# Patient Record
Sex: Female | Born: 1990 | Race: White | Hispanic: No | Marital: Married | Smoking: Former smoker
Health system: Southern US, Community
[De-identification: ages and names within clinical notes are randomized; demographics above are authoritative.]

## PROBLEM LIST (undated history)

## (undated) DIAGNOSIS — M797 Fibromyalgia: Secondary | ICD-10-CM

## (undated) DIAGNOSIS — M5136 Other intervertebral disc degeneration, lumbar region: Secondary | ICD-10-CM

## (undated) DIAGNOSIS — M5134 Other intervertebral disc degeneration, thoracic region: Secondary | ICD-10-CM

## (undated) HISTORY — DX: Fibromyalgia: M79.7

## (undated) HISTORY — DX: Other intervertebral disc degeneration, lumbar region: M51.36

## (undated) HISTORY — DX: Other intervertebral disc degeneration, thoracic region: M51.34

---

## 2021-09-23 NOTE — Progress Notes (Signed)
°  Subjective:    Faith Lawson - 31 y.o. female MRN [id number]  Date of birth: 19-Jun-1991  HPI  Faith Lawson is to establish care. Recently moved from [place] to [place] 6 months ago.   Current issues and/or concerns: None   ROS per HPI     Health Maintenance:  Health Maintenance Due  Topic Date Due   COVID-19 Vaccine (1) Never done   HIV Screening  Never done   Hepatitis C Screening  Never done   TETANUS/TDAP  Never done   PAP SMEAR-Modifier  Never done     Past Medical History: There are no problems to display for this patient.   Social History   reports that she has quit smoking. Her smoking use included cigarettes. She has been exposed to tobacco smoke. She has never used smokeless tobacco. She reports current alcohol use. She reports that she does not use drugs.   Family History  family history includes Cancer - Colon in her maternal grandmother.   Medications: reviewed and updated   Objective:   Physical Exam BP 115/87 (BP Location: Left Arm, Patient Position: Sitting, Cuff Size: Normal)    Pulse 100    Temp 98.3 F (36.8 C)    Resp 18    Ht 5' 6.42" (1.687 m)    Wt 220 lb (99.8 kg)    SpO2 97%    BMI 35.06 kg/m   Physical Exam Eyes:     Extraocular Movements: Extraocular movements intact.     Conjunctiva/sclera: Conjunctivae normal.     Pupils: Pupils are equal, round, and reactive to light.  Cardiovascular:     Rate and Rhythm: Normal rate and regular rhythm.     Pulses: Normal pulses.     Heart sounds: Normal heart sounds.  Pulmonary:     Effort: Pulmonary effort is normal.     Breath sounds: Normal breath sounds.  Musculoskeletal:     Cervical back: Normal range of motion and neck supple.  Neurological:     General: No focal deficit present.     Mental Status: She is alert and oriented to person, place, and time.  Psychiatric:        Mood and Affect: Mood normal.        Behavior: Behavior normal.     Assessment & Plan:  1. Encounter  to establish care: - Patient presents today to establish care.  - Return for annual physical examination, labs, and health maintenance. Arrive fasting meaning having no food for at least 8 hours prior to appointment. You may have only water or black coffee. Please take scheduled medications as normal.    Patient was given clear instructions to go to Emergency Department or return to medical center if symptoms don't improve, worsen, or new problems develop.The patient verbalized understanding.  I discussed the assessment and treatment plan with the patient. The patient was provided an opportunity to ask questions and all were answered. The patient agreed with the plan and demonstrated an understanding of the instructions.   The patient was advised to call back or seek an in-person evaluation if the symptoms worsen or if the condition fails to improve as anticipated.    Ricky Stabs, NP 09/30/2021, 10:06 AM Primary Care at [facility]

## 2021-09-30 ENCOUNTER — Encounter (INDEPENDENT_AMBULATORY_CARE_PROVIDER_SITE_OTHER): Payer: Self-pay

## 2021-09-30 ENCOUNTER — Encounter: Payer: Self-pay | Admitting: Family

## 2021-09-30 ENCOUNTER — Other Ambulatory Visit: Payer: Self-pay

## 2021-09-30 ENCOUNTER — Ambulatory Visit (INDEPENDENT_AMBULATORY_CARE_PROVIDER_SITE_OTHER): Payer: 59 | Admitting: Family

## 2021-09-30 VITALS — BP 115/87 | HR 100 | Temp 98.3°F | Resp 18 | Ht 66.42 in | Wt 220.0 lb

## 2021-09-30 DIAGNOSIS — Z7689 Persons encountering health services in other specified circumstances: Secondary | ICD-10-CM | POA: Diagnosis not present

## 2021-09-30 NOTE — Patient Instructions (Signed)
Thank you for choosing Primary Care at [facility] for your medical home!    Faith Lawson was seen by Rema Fendt, NP today.   Donnamarie Poag primary care provider is Ricky Stabs, NP.   For the best care possible,  you should try to see Ricky Stabs, NP whenever you come to clinic.   We look forward to seeing you again soon!  If you have any questions about your visit today,  please call us at [phone]  Or feel free to reach your provider via MyChart.    Keeping you healthy   Get these tests Blood pressure- Have your blood pressure checked once a year by your healthcare provider.  Normal blood pressure is 120/80. Weight- Have your body mass index (BMI) calculated to screen for obesity.  BMI is a measure of body fat based on height and weight. You can also calculate your own BMI at https://www.west-esparza.com/. Cholesterol- Have your cholesterol checked regularly starting at age 10, sooner may be necessary if you have diabetes, high blood pressure, if a family member developed heart diseases at an early age or if you smoke.  Chlamydia, HIV, and other sexual transmitted disease- Get screened each year until the age of 55 then within three months of each new sexual partner. Diabetes- Have your blood sugar checked regularly if you have high blood pressure, high cholesterol, a family history of diabetes or if you are overweight.   Get these vaccines Flu shot- Every fall. Tetanus shot- Every 10 years. Menactra- Single dose; prevents meningitis.   Take these steps Don't smoke- If you do smoke, ask your healthcare provider about quitting. For tips on how to quit, go to www.smokefree.gov or call 1-800-QUIT-NOW. Be physically active- Exercise 5 days a week for at least 30 minutes.  If you are not already physically active start slow and gradually work up to 30 minutes of moderate physical activity.  Examples of moderate activity include walking briskly, mowing the yard, dancing, swimming  bicycling, etc. Eat a healthy diet- Eat a variety of healthy foods such as fruits, vegetables, low fat milk, low fat cheese, yogurt, lean meats, poultry, fish, beans, tofu, etc.  For more information on healthy eating, go to www.thenutritionsource.org Drink alcohol in moderation- Limit alcohol intake two drinks or less a day.  Never drink and drive. Dentist- Brush and floss teeth twice daily; visit your dentis twice a year. Depression-Your emotional health is as important as your physical health.  If you're feeling down, losing interest in things you normally enjoy please talk with your healthcare provider. Gun Safety- If you keep a gun in your home, keep it unloaded and with the safety lock on.  Bullets should be stored separately. Helmet use- Always wear a helmet when riding a motorcycle, bicycle, rollerblading or skateboarding. Safe sex- If you may be exposed to a sexually transmitted infection, use a condom Seat belts- Seat bels can save your life; always wear one. Smoke/Carbon Monoxide detectors- These detectors need to be installed on the appropriate level of your home.  Replace batteries at least once a year. Skin Cancer- When out in the sun, cover up and use sunscreen SPF 15 or higher. Violence- If anyone is threatening or hurting you, please tell your healthcare provider.

## 2021-09-30 NOTE — Progress Notes (Signed)
Pt presents to establish care, pt has moved here from [place] no major concerns today

## 2021-10-18 NOTE — Progress Notes (Signed)
Patient ID: Faith Lawson, female    DOB: [birth year]  MRN: [id number]  CC: Wrist Pain   Subjective: Faith Lawson is a 31 y.o. female who presents for wrist joint pain.   Her concerns today include:  Concerns for bilateral wrist and elbow pain, denies injury/trauma. Currently right > left. She is right-handed. Ongoing for years. Reports recently diagnosed with fibromyalgia around 12 months ago while living in [place] and thought wrist/elbow pain related to the same. At that time her primary care provider referred her to Rheumatology. The referral was denied stating Rheumatology did not see patients with fibromyalgia. 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   Recent appointment with physical therapist suggested patient may have Ehlers-Danlos. Was told to not remove the right wrist splint because it may cause significant side effects if she does. Patient reports she began to Google Ehlers-Danlos and became very concerned. Endorses heart palpitations lasting for minutes occurring at least weekly. Denies chest pain, shortness of breath, and additional red flag symptoms. Does have anxiety sometimes. Not interested in adding any medications at the moment until she finds out what is causing the wrist/elbow pain.    Depression screen [facility] 2/9 10/21/2021 09/30/2021  Decreased Interest 0 0  Down, Depressed, Hopeless 0 0  PHQ - 2 Score 0 0    Current Outpatient Medications on File Prior to Visit  Medication Sig Dispense Refill   cyclobenzaprine (FLEXERIL) 10 MG tablet Take 10 mg by mouth daily.     DULoxetine (CYMBALTA) 60 MG capsule Take 60 mg by mouth daily.     norethindrone-ethinyl estradiol-FE (HAILEY FE 1/20) 1-20 MG-MCG tablet Take 1 tablet by mouth daily.     zolpidem (AMBIEN) 10 MG tablet Take 10 mg by mouth at bedtime as needed for sleep.     No current facility-administered medications on file prior to visit.    Allergies  Allergen Reactions    Penicillins Rash    Social History   Socioeconomic History   Marital status: Married    Spouse name: Not on file   Number of children: Not on file   Years of education: Not on file   Highest education level: Not on file  Occupational History   Not on file  Tobacco Use   Smoking status: Former    Types: Cigarettes    Passive exposure: Current   Smokeless tobacco: Never  Vaping Use   Vaping Use: Never used  Substance and Sexual Activity   Alcohol use: Yes    Comment: occassionally   Drug use: Never   Sexual activity: Yes    Birth control/protection: Pill  Other Topics Concern   Not on file  Social History Narrative   Not on file   Social Determinants of Health   Financial Resource Strain: Not on file  Food Insecurity: Not on file  Transportation Needs: Not on file  Physical Activity: Not on file  Stress: Not on file  Social Connections: Not on file  Intimate Partner Violence: Not on file    Family History  Problem Relation Age of Onset   Cancer - Colon Maternal Grandmother     Past Surgical History:  Procedure Laterality Date   CESAREAN SECTION      ROS: Review of Systems Negative except as stated above  PHYSICAL EXAM: BP 114/79 (BP Location: Left Arm, Patient Position: Sitting, Cuff Size: Large)    Pulse 99    Temp 98.3 F (36.8 C)  Resp 16    Ht 5' 6.42" (1.687 m)    Wt 220 lb (99.8 kg)    SpO2 96%    BMI 35.06 kg/m   Physical Exam HENT:     Head: Normocephalic and atraumatic.  Eyes:     Extraocular Movements: Extraocular movements intact.     Conjunctiva/sclera: Conjunctivae normal.     Pupils: Pupils are equal, round, and reactive to light.  Cardiovascular:     Rate and Rhythm: Normal rate and regular rhythm.     Pulses: Normal pulses.     Heart sounds: Normal heart sounds.  Pulmonary:     Effort: Pulmonary effort is normal.     Breath sounds: Normal breath sounds.  Musculoskeletal:     Cervical back: Normal range of motion and neck  supple.  Neurological:     General: No focal deficit present.     Mental Status: She is alert and oriented to person, place, and time.  Psychiatric:        Mood and Affect: Mood normal.        Behavior: Behavior normal.   ASSESSMENT AND PLAN: 1. Fibromyalgia: - Referral to Rheumatology for further evaluation and management.  - Ambulatory referral to Rheumatology  2. Chronic pain of both wrists: 3. Chronic elbow pain, left: 4. Chronic elbow pain, right: - Referral to Orthopedic Surgery for further evaluation and management.  - Ambulatory referral to Orthopedic Surgery  5. Palpitations: - May be secondary to anxiety. - Referral to Cardiology for further evaluation and management. - Ambulatory referral to Cardiology    Patient was given the opportunity to ask questions.  Patient verbalized understanding of the plan and was able to repeat key elements of the plan. Patient was given clear instructions to go to Emergency Department or return to medical center if symptoms don't improve, worsen, or new problems develop.The patient verbalized understanding.   Orders Placed This Encounter  Procedures   Ambulatory referral to Orthopedic Surgery   Ambulatory referral to Rheumatology   Ambulatory referral to Cardiology    Follow-up with primary provider as scheduled.  Camillia Herter, NP

## 2021-10-21 ENCOUNTER — Other Ambulatory Visit: Payer: Self-pay

## 2021-10-21 ENCOUNTER — Ambulatory Visit (INDEPENDENT_AMBULATORY_CARE_PROVIDER_SITE_OTHER): Payer: 59 | Admitting: Family

## 2021-10-21 ENCOUNTER — Encounter: Payer: Self-pay | Admitting: Family

## 2021-10-21 VITALS — BP 114/79 | HR 99 | Temp 98.3°F | Resp 16 | Ht 66.42 in | Wt 220.0 lb

## 2021-10-21 DIAGNOSIS — M25531 Pain in right wrist: Secondary | ICD-10-CM

## 2021-10-21 DIAGNOSIS — M797 Fibromyalgia: Secondary | ICD-10-CM | POA: Diagnosis not present

## 2021-10-21 DIAGNOSIS — G8929 Other chronic pain: Secondary | ICD-10-CM

## 2021-10-21 DIAGNOSIS — R002 Palpitations: Secondary | ICD-10-CM

## 2021-10-21 DIAGNOSIS — M25522 Pain in left elbow: Secondary | ICD-10-CM | POA: Diagnosis not present

## 2021-10-21 DIAGNOSIS — M25521 Pain in right elbow: Secondary | ICD-10-CM

## 2021-10-21 DIAGNOSIS — M25532 Pain in left wrist: Secondary | ICD-10-CM

## 2021-10-21 NOTE — Progress Notes (Signed)
Pt presents for right wrist pain states that it is constant and it comes and goes often

## 2021-10-21 NOTE — Patient Instructions (Signed)
Wrist Pain, Adult ?There are many things that can cause wrist pain. Some common causes include: ?An injury to the wrist. ?Using the joint too much. ?A condition that causes too much pressure to be put on a nerve in the wrist (carpal tunnel syndrome). ?Wear and tear of the joints that happens as a person gets older (osteoarthritis). ?A condition that causes swelling and stiffness in the joints (arthritis). ?Sometimes, the cause of wrist pain is not known. ?Often, the pain goes away when you follow your doctor's instructions for easing pain at home. This may include resting your wrist, icing your wrist, or using a splint or an elastic wrap for a short time. It is important to tell your doctor if your wrist pain does not go away. ?Follow these instructions at home: ?If you have a splint or elastic wrap: ?Wear the splint or wrap as told by your doctor. Take it off only as told by your doctor. Ask if you can take it off for bathing. ?Loosen the splint or wrap if your fingers: ?Tingle. ?Become numb. ?Turn cold and blue. ?Check the skin around the splint or wrap every day. Tell your doctor about any concerns. ?Keep the splint or wrap clean. ?If the splint or wrap is not waterproof: ?Do not let it get wet. ?Cover it with a watertight covering when you take a bath or shower. ?Managing pain, stiffness, and swelling ? ?If told, put ice on the painful area. To do this: ?If you have a removable splint or wrap, take it off as told by your doctor. ?Put ice in a plastic bag. ?Place a towel between your skin and the bag or between your splint or wrap and the bag. ?Leave the ice on for 20 minutes, 2-3 times a day. ?Move your fingers often. ?Raise (elevate) the injured area above the level of your heart while you are sitting or lying down. ?Activity ?Rest your wrist as told by your doctor. ?Return to your normal activities as told by your doctor. Ask your doctor what activities are safe for you. ?Ask your doctor when it is safe to  drive if you have a splint or wrap on your wrist. ?Do exercises as told by your doctor. ?General instructions ?Pay attention to any changes in your symptoms. ?Take over-the-counter and prescription medicines only as told by your doctor. ?Keep all follow-up visits as told by your doctor. This is important. ?Contact a doctor if: ?You have a sudden, sharp pain in the wrist, hand, or arm that is different or new. ?The swelling or bruising on your wrist or hand gets worse. ?Your skin: ?Becomes red. ?Gets a rash. ?Has open sores. ?Your pain does not get better. ?Your pain gets worse. ?You have a fever or chills. ?Get help right away if: ?You lose feeling in your fingers or hand. ?Your fingers turn white, very red, or cold and blue. ?You cannot move your fingers. ?Summary ?There are many things that can cause wrist pain. ?It is important to tell your doctor if your wrist pain does not go away. ?You may need to wear a splint or a wrap for a short period of time. ?Return to your normal activities as told by your doctor. Ask your doctor what activities are safe for you. ?This information is not intended to replace advice given to you by your health care provider. Make sure you discuss any questions you have with your health care provider. ?Document Revised: 07/28/2019 Document Reviewed: 07/28/2019 ?Elsevier Patient Education ? 2022   Elsevier Inc. ? ?

## 2021-10-29 ENCOUNTER — Encounter: Payer: Self-pay | Admitting: Orthopedic Surgery

## 2021-10-29 ENCOUNTER — Other Ambulatory Visit: Payer: Self-pay

## 2021-10-29 ENCOUNTER — Ambulatory Visit (INDEPENDENT_AMBULATORY_CARE_PROVIDER_SITE_OTHER): Payer: 59 | Admitting: Family

## 2021-10-29 ENCOUNTER — Ambulatory Visit (INDEPENDENT_AMBULATORY_CARE_PROVIDER_SITE_OTHER): Payer: 59 | Admitting: Orthopedic Surgery

## 2021-10-29 DIAGNOSIS — M79642 Pain in left hand: Secondary | ICD-10-CM

## 2021-10-29 DIAGNOSIS — M79641 Pain in right hand: Secondary | ICD-10-CM

## 2021-10-29 DIAGNOSIS — Q796 Ehlers-Danlos syndrome, unspecified: Secondary | ICD-10-CM

## 2021-10-29 DIAGNOSIS — M25522 Pain in left elbow: Secondary | ICD-10-CM

## 2021-10-29 DIAGNOSIS — M25521 Pain in right elbow: Secondary | ICD-10-CM | POA: Diagnosis not present

## 2021-10-29 NOTE — Progress Notes (Signed)
Virtual Visit via Telephone Note  I connected with Faith Lawson, on 10/29/2021 at 3:19 PM by telephone due to the COVID-19 pandemic and verified that I am speaking with the correct person using two identifiers.  Due to current restrictions/limitations of in-office visits due to the COVID-19 pandemic, this scheduled clinical appointment was converted to a telehealth visit.   Consent: I discussed the limitations, risks, security and privacy concerns of performing an evaluation and management service by telephone and the availability of in person appointments. I also discussed with the patient that there may be a patient responsible charge related to this service. The patient expressed understanding and agreed to proceed.   Location of Patient: Home  Location of Provider: [facility]   Persons participating in Telemedicine visit: Wayna Chalet, NP Vertis Kelch, CMA   History of Present Illness: Faith Lawson is a 31 year-old female who presents for recent visit to Orthopedics office visit follow-up.   ORTHOPEDIC OFFICE VISIT FOLLOW-UP: 10/29/2021 at [facility] with Marlyne Beards, MD: Assessment & Plan: Visit Diagnoses:  1. Bilateral hand pain   2. Bilateral elbow joint pain       Plan: Discussed with patient that she has a lot of issues going on that seem to be related to her chronic back pain and fibromyalgia.  She does have some evidence of diffuse joint laxity.  She has definite midcarpal laxity with a subtle clunk on examination.  Discussed the role of therapy for joint stabilization and overall strengthening.  I do not have much to recommend at this point beyond therapy.  She is in an integrative therapy program now which I think is the best strategy for her.   Follow-Up Instructions: No follow-ups on file.   10/29/2021: Reports Orthopedic doctor recommended patient to follow-up with primary care for referral to  Rheumatology for possible Ehlers-Danlos syndrome.   Past Medical History:  Diagnosis Date   DDD (degenerative disc disease), lumbar    DDD (degenerative disc disease), thoracic    Fibromyalgia    Allergies  Allergen Reactions   Penicillins Rash    Current Outpatient Medications on File Prior to Visit  Medication Sig Dispense Refill   cyclobenzaprine (FLEXERIL) 10 MG tablet Take 10 mg by mouth daily.     DULoxetine (CYMBALTA) 60 MG capsule Take 60 mg by mouth daily.     norethindrone-ethinyl estradiol-FE (HAILEY FE 1/20) 1-20 MG-MCG tablet Take 1 tablet by mouth daily.     zolpidem (AMBIEN) 10 MG tablet Take 10 mg by mouth at bedtime as needed for sleep.     No current facility-administered medications on file prior to visit.    Observations/Objective: Alert and oriented x 3. Not in acute distress. Physical examination not completed as this is a telemedicine visit.  Assessment and Plan: 1. Ehlers-Danlos syndrome: - Referral to Rheumatology for further evaluation and management.  - Ambulatory referral to Rheumatology   Follow Up Instructions: Referral to Rheumatology. Follow-up with primary provider as scheduled.   Patient was given clear instructions to go to Emergency Department or return to medical center if symptoms don't improve, worsen, or new problems develop.The patient verbalized understanding.  I discussed the assessment and treatment plan with the patient. The patient was provided an opportunity to ask questions and all were answered. The patient agreed with the plan and demonstrated an understanding of the instructions.   The patient was advised to call back or seek an in-person evaluation if  the symptoms worsen or if the condition fails to improve as anticipated.    I provided 5 minutes total of non-face-to-face time during this encounter.   Rema Fendt, NP  [facility] at [facility] [place], [place] [phone] 10/29/2021, 3:19 PM

## 2021-10-29 NOTE — Progress Notes (Signed)
Office Visit Note   Patient: Faith Lawson           Date of Birth: 1991/09/02           MRN: [id number] Visit Date: 10/29/2021              Requested by: Rema Fendt, NP [address] [place],  [place] [address] PCP: Rema Fendt, NP   Assessment & Plan: Visit Diagnoses:  1. Bilateral hand pain   2. Bilateral elbow joint pain     Plan: Discussed with patient that she has a lot of issues going on that seem to be related to her chronic back pain and fibromyalgia.  She does have some evidence of diffuse joint laxity.  She has definite midcarpal laxity with a subtle clunk on examination.  Discussed the role of therapy for joint stabilization and overall strengthening.  I do not have much to recommend at this point beyond therapy.  She is in an integrative therapy program now which I think is the best strategy for her.  Follow-Up Instructions: No follow-ups on file.   Orders:  No orders of the defined types were placed in this encounter.  No orders of the defined types were placed in this encounter.     Procedures: No procedures performed   Clinical Data: No additional findings.   Subjective: Chief Complaint  Patient presents with   Right Wrist - Pain    Pops   Left Wrist - Pain    Pops     This is a 31 year old right-hand-dominant female with a history of fibromyalgia and chronic pain who presents with chronic pain involving bilateral elbows and wrists.  She says her right has been worse than the left.  This been going on for years and for as long as she can remember.  She describes pain in bilateral elbows and bilateral wrists that are worse with prolonged activity.  The wrist pain is poorly localized and seems diffuse at the volar and dorsal aspect of the wrist.  She says her wrist will occasionally "pop" and "shift".  She has no known injury.  She was told that she may have Ehlers-Danlos syndrome.  She also describes pain in multiple areas including  bilateral clavicles, shoulders, neck, knees, hips, spine.  She says she can feel her cervical spine become misaligned with subsequent nerve pinching.  She says she is able to realign her spine and decompress the nerve on her own.  She is currently in an integrative therapy program for her multiple complaints.   Review of Systems   Objective: Vital Signs: BP 106/74 (BP Location: Left Arm, Patient Position: Sitting)    Pulse (!) 118    Ht 5\' 6"  (1.676 m)    Wt 220 lb (99.8 kg)    BMI 35.51 kg/m   Physical Exam Constitutional:      Appearance: Normal appearance.  Cardiovascular:     Rate and Rhythm: Normal rate.     Pulses: Normal pulses.  Pulmonary:     Effort: Pulmonary effort is normal.  Skin:    General: Skin is warm and dry.     Capillary Refill: Capillary refill takes less than 2 seconds.  Neurological:     Mental Status: She is alert.    Right Hand Exam   Tenderness  The patient is experiencing no tenderness.   Range of Motion  The patient has normal right wrist ROM.   Comments:  Subtle "clunk" with shucking  of midcarpal joint w/ palmarly directed force on hand w/ wrist stabilized.  Wrist exam otherwise normal.    Left Hand Exam   Tenderness  The patient is experiencing no tenderness.   Range of Motion  The patient has normal left wrist ROM.  Comments:  Subtle "clunk" with shucking of midcarpal joint w/ palmarly directed force on hand w/ wrist stabilized.  Wrist exam otherwise normal.    Right Elbow Exam   Tenderness  The patient is experiencing no tenderness.   Range of Motion  The patient has normal right elbow ROM.  Muscle Strength  The patient has normal right elbow strength.  Other  Erythema: absent Sensation: normal Pulse: present   Left Elbow Exam   Tenderness  The patient is experiencing no tenderness.   Range of Motion  The patient has normal left elbow ROM.  Muscle Strength  The patient has normal left elbow strength.  Other   Erythema: absent Sensation: normal Pulse: present     Specialty Comments:  No specialty comments available.  Imaging: No results found.   PMFS History: Patient Active Problem List   Diagnosis Date Noted   Bilateral hand pain 10/29/2021   Bilateral elbow joint pain 10/29/2021   Past Medical History:  Diagnosis Date   DDD (degenerative disc disease), lumbar    DDD (degenerative disc disease), thoracic    Fibromyalgia     Family History  Problem Relation Age of Onset   Cancer - Colon Maternal Grandmother     Past Surgical History:  Procedure Laterality Date   CESAREAN SECTION     Social History   Occupational History   Not on file  Tobacco Use   Smoking status: Former    Types: Cigarettes    Passive exposure: Current   Smokeless tobacco: Never  Vaping Use   Vaping Use: Never used  Substance and Sexual Activity   Alcohol use: Yes    Comment: occassionally   Drug use: Never   Sexual activity: Yes    Birth control/protection: Pill

## 2021-11-04 ENCOUNTER — Ambulatory Visit (INDEPENDENT_AMBULATORY_CARE_PROVIDER_SITE_OTHER): Payer: 59 | Admitting: Internal Medicine

## 2021-11-04 ENCOUNTER — Other Ambulatory Visit: Payer: Self-pay

## 2021-11-04 VITALS — BP 126/88 | HR 88 | Ht 66.0 in | Wt 225.6 lb

## 2021-11-04 DIAGNOSIS — R002 Palpitations: Secondary | ICD-10-CM

## 2021-11-04 NOTE — Patient Instructions (Signed)
° °  Follow-Up: At [facility], you and your health needs are our priority.  As part of our continuing mission to provide you with exceptional heart care, we have created designated Provider Care Teams.  These Care Teams include your primary Cardiologist (physician) and Advanced Practice Providers (APPs -  Physician Assistants and Nurse Practitioners) who all work together to provide you with the care you need, when you need it.  Your next appointment:   As needed  The format for your next appointment:   In Person  Provider:   Maisie Fus, MD

## 2021-11-04 NOTE — Progress Notes (Deleted)
Cardiology Office Note:    Date:  11/04/2021   ID:  [name], DOB [birth year], MRN [id number]  PCP:  Rema Fendt, NP   [facility] Providers Cardiologist:  None { Click to update primary MD,subspecialty MD or APP then REFRESH:1}    Referring MD: Rema Fendt, NP   No chief complaint on file. Palpitations  History of Present Illness:    Faith Lawson is a 31 y.o. female with a hx of fibromyalgia, referral for palpitations  Past Medical History:  Diagnosis Date   DDD (degenerative disc disease), lumbar    DDD (degenerative disc disease), thoracic    Fibromyalgia     Past Surgical History:  Procedure Laterality Date   CESAREAN SECTION      Current Medications: No outpatient medications have been marked as taking for the 11/04/21 encounter (Appointment) with Maisie Fus, MD.     Allergies:   Penicillins   Social History   Socioeconomic History   Marital status: Married    Spouse name: Not on file   Number of children: Not on file   Years of education: Not on file   Highest education level: Not on file  Occupational History   Not on file  Tobacco Use   Smoking status: Former    Types: Cigarettes    Passive exposure: Current   Smokeless tobacco: Never  Vaping Use   Vaping Use: Never used  Substance and Sexual Activity   Alcohol use: Yes    Comment: occassionally   Drug use: Never   Sexual activity: Yes    Birth control/protection: Pill  Other Topics Concern   Not on file  Social History Narrative   Not on file   Social Determinants of Health   Financial Resource Strain: Not on file  Food Insecurity: Not on file  Transportation Needs: Not on file  Physical Activity: Not on file  Stress: Not on file  Social Connections: Not on file     Family History: The patient's ***family history includes Cancer - Colon in her maternal grandmother.  ROS:   Please see the history of present illness.    *** All other systems reviewed and are  negative.  EKGs/Labs/Other Studies Reviewed:    The following studies were reviewed today: ***  EKG:  EKG is *** ordered today.  The ekg ordered today demonstrates ***  Recent Labs: No results found for requested labs within last 8760 hours.  Recent Lipid Panel No results found for: CHOL, TRIG, HDL, CHOLHDL, VLDL, LDLCALC, LDLDIRECT   Risk Assessment/Calculations:   {Does this patient have ATRIAL FIBRILLATION?:[id number]}       Physical Exam:    VS:  There were no vitals taken for this visit.    Wt Readings from Last 3 Encounters:  10/29/21 220 lb (99.8 kg)  10/21/21 220 lb (99.8 kg)  09/30/21 220 lb (99.8 kg)     GEN: *** Well nourished, well developed in no acute distress HEENT: Normal NECK: No JVD; No carotid bruits LYMPHATICS: No lymphadenopathy CARDIAC: ***RRR, no murmurs, rubs, gallops RESPIRATORY:  Clear to auscultation without rales, wheezing or rhonchi  ABDOMEN: Soft, non-tender, non-distended MUSCULOSKELETAL:  No edema; No deformity  SKIN: Warm and dry NEUROLOGIC:  Alert and oriented x 3 PSYCHIATRIC:  Normal affect   ASSESSMENT:    No diagnosis found. PLAN:    In order of problems listed above:  ***      {Are you ordering a CV Procedure (e.g. stress test, cath, DCCV,  TEE, etc)?   Press F2        :[id number]}    Medication Adjustments/Labs and Tests Ordered: Current medicines are reviewed at length with the patient today.  Concerns regarding medicines are outlined above.  No orders of the defined types were placed in this encounter.  No orders of the defined types were placed in this encounter.   There are no Patient Instructions on file for this visit.   Signed, Maisie Fus, MD  11/04/2021 8:26 AM    [facility]

## 2021-11-04 NOTE — Progress Notes (Signed)
Cardiology Office Note:    Date:  11/04/2021   ID:  Lorre Munroe, DOB [birth year], MRN [id number]  PCP:  Camillia Herter, NP   [facility] Providers Cardiologist:  Janina Mayo, MD     Referring MD: Camillia Herter, NP   No chief complaint on file. Palpitations  History of Present Illness:    Faith Lawson is a 31 y.o. female with a hx of fibromyalgia, being worked for Ehlers-Danlos referral for palpitations  She notes that her heart beats fast. The sensation is random. There is no trigger. She is unclear when it started. She has episodes of orthostatic pre syncope. She notes one episode of syncope after standing a couple years ago.  No congenital heart disease hx. She is being working up for Ehlers-Danlos. Her EKG was normal. She has no family hx of Ehlers-Danlos, connective tissue disease or aortopathies. No family hx of SCD. She is not concerned about her infrequent palpitations. No hx of POTs.  Orthostatics:  Lying 128/86 mmHg, HR 83 bpm  Sitting 137/86 mmHg, HR 99 bpm  Standing  130/88 mmHg, 100 bpm  Standing 3 min 119/82 mmHg, 89 bpm  Past Medical History:  Diagnosis Date   DDD (degenerative disc disease), lumbar    DDD (degenerative disc disease), thoracic    Fibromyalgia     Past Surgical History:  Procedure Laterality Date   CESAREAN SECTION      Current Medications: No outpatient medications have been marked as taking for the 11/04/21 encounter (Office Visit) with Janina Mayo, MD.     Allergies:   Penicillins   Social History   Socioeconomic History   Marital status: Married    Spouse name: Not on file   Number of children: Not on file   Years of education: Not on file   Highest education level: Not on file  Occupational History   Not on file  Tobacco Use   Smoking status: Former    Types: Cigarettes    Passive exposure: Current   Smokeless tobacco: Never  Vaping Use   Vaping Use: Never used  Substance and Sexual Activity    Alcohol use: Yes    Comment: occassionally   Drug use: Never   Sexual activity: Yes    Birth control/protection: Pill  Other Topics Concern   Not on file  Social History Narrative   Not on file   Social Determinants of Health   Financial Resource Strain: Not on file  Food Insecurity: Not on file  Transportation Needs: Not on file  Physical Activity: Not on file  Stress: Not on file  Social Connections: Not on file     Family History: The patient's family history includes Cancer - Colon in her maternal grandmother.  ROS:   Please see the history of present illness.     All other systems reviewed and are negative.  EKGs/Labs/Other Studies Reviewed:    The following studies were reviewed today:   EKG:  EKG is  ordered today.  The ekg ordered today demonstrates NSR,   Recent Labs: No results found for requested labs within last 8760 hours.  Recent Lipid Panel No results found for: CHOL, TRIG, HDL, CHOLHDL, VLDL, LDLCALC, LDLDIRECT   Risk Assessment/Calculations:           Physical Exam:    VS:   Vitals:   11/04/21 1007  BP: 126/88  Pulse: 88  SpO2: 98%     Wt Readings from Last 3 Encounters:  11/04/21  225 lb 9.6 oz (102.3 kg)  10/29/21 220 lb (99.8 kg)  10/21/21 220 lb (99.8 kg)     GEN:  Well nourished, well developed in no acute distress HEENT: Normal NECK: No JVD; No carotid bruits LYMPHATICS: No lymphadenopathy CARDIAC: RRR, no murmurs, rubs, gallops RESPIRATORY:  Clear to auscultation without rales, wheezing or rhonchi  ABDOMEN: Soft, non-tender, non-distended MUSCULOSKELETAL:  No edema; No deformity  SKIN: Warm and dry NEUROLOGIC:  Alert and oriented x 3 PSYCHIATRIC:  Normal affect   ASSESSMENT:    #Palpitations: She has infrequent symptoms.  Her orthostatics were not suggestive of POTS. Discussed that if she has persistent LH, dizziness, syncope to let us know and can re-evaluate with a cardiac monitor. No further work up is planned at  this time.   PLAN:    In order of problems listed above:  Follow up PRN      Medication Adjustments/Labs and Tests Ordered: Current medicines are reviewed at length with the patient today.  Concerns regarding medicines are outlined above.  Orders Placed This Encounter  Procedures   EKG 12-Lead   No orders of the defined types were placed in this encounter.   Patient Instructions    Follow-Up: At [facility], you and your health needs are our priority.  As part of our continuing mission to provide you with exceptional heart care, we have created designated Provider Care Teams.  These Care Teams include your primary Cardiologist (physician) and Advanced Practice Providers (APPs -  Physician Assistants and Nurse Practitioners) who all work together to provide you with the care you need, when you need it.  Your next appointment:   As needed  The format for your next appointment:   In Person  Provider:   Janina Mayo, MD       Signed, Janina Mayo, MD  11/04/2021 10:47 AM    [place]

## 2022-01-16 NOTE — Progress Notes (Signed)
? ?Office Visit Note ? ?Patient: Faith Lawson             ?Date of Birth: Apr 12, 1991           ?MRN: [id number]             ?PCP: Rema FendtStephens, Amy J, NP ?Referring: Rema FendtStephens, Amy J, NP ?Visit Date: 01/29/2022 ?Occupation: @GUAROCC @ ? ?Subjective:  ?Pain in multiple joints and muscles ? ?History of Present Illness: Faith Lawson is a 31 y.o. female seen in consultation per request of her PCP for the evaluation of joint and muscle pain.  According the patient about 10 years ago [facility] in [facility] lower extremities.  At the time she was seen by a sports medicine doctor in [place] and had modified EMG and was diagnosed with compartment syndrome.  Patient states that she was advised not to have surgery.  She was advised to modify her physical activity which she did for several years.  She states over time the symptoms got worse with pain all over her body involving her joints and muscles.  She saw several physicians over the years.  [place] and was evaluated by her PCP who diagnosed her with fibromyalgia syndrome.  She was placed on Cymbalta and Flexeril about couple of years ago which helped to some extent only.  She was sent to several specialist including a neurologist as well as what to write an orthopedic surgeon.  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  She states she continues to have pain and stiffness all over.  She also has severe flares to the point she cannot get out of bed.  She has been also experiencing headaches which has been better for the last 2 years.  She notices popping in almost all of her joints.  She states that she has discomfort in her cervical, thoracic and lumbar spine, her shoulder joints pop she has discomfort in her wrist joints, hands, trochanteric area, knee joints, ankles and her feet.  She has popping sensation in almost all of her  joints.  [place] and establish with a PCP.  She is also concerned about hypermobility.  She had cardiology work-up here which was negative.  She continues to have palpitations.  She also gives history of chronic insomnia and fatigue.  Her mother has fibromyalgia and hypermobility.  There is history of MS in her maternal grandmother.  She is gravida 3, para 1, miscarriage 1, abortion 1.  There is no history of DVTs. ? ?Activities of Daily Living:  ?Patient reports morning stiffness for several hours.   ?Patient Reports nocturnal pain.  ?Difficulty dressing/grooming: Reports ?Difficulty climbing stairs: Reports ?Difficulty getting out of chair: Denies ?Difficulty using hands for taps, buttons, cutlery, and/or writing: Reports ? ?Review of Systems  ?Constitutional:  Positive for fatigue.  ?HENT:  Negative for mouth sores, mouth dryness and nose dryness.   ?Eyes:  Negative for pain, itching and dryness.  ?Respiratory:  Positive for shortness of breath. Negative for difficulty breathing.   ?Cardiovascular:  Positive for palpitations. Negative for chest pain.  ?     Cardiology work up negative per patient  ?Gastrointestinal:  Positive for constipation and diarrhea. Negative for blood in stool.  ?Endocrine: Negative for increased urination.  ?Genitourinary:  Negative for difficulty urinating.  ?Musculoskeletal:  Positive for  joint pain, joint pain, joint swelling, myalgias, morning stiffness, muscle tenderness and myalgias.  ?Skin:  Positive for color change. Negative for rash, redness and sensitivity to sunlight.  ?Allergic/Immunologic: Negative for susceptible to infections.  ?Neurological:  Positive for dizziness, numbness, headaches, memory loss and weakness.  ?Hematological:  Positive for bruising/bleeding tendency.  ?Psychiatric/Behavioral:  Positive for sleep disturbance. Negative for confusion. The patient is nervous/anxious.   ? ?PMFS History:  ?Patient Active Problem List  ? Diagnosis  Date Noted  ? Bilateral hand pain 10/29/2021  ? Bilateral elbow joint pain 10/29/2021  ?  ?Past Medical History:  ?Diagnosis Date  ? DDD (degenerative disc disease), lumbar   ? DDD (degenerative disc disease), thoracic   ? Fibromyalgia   ?  ?Family History  ?Problem Relation Age of Onset  ? Fibromyalgia Mother   ? Hypertension Father   ? Healthy Brother   ? Cancer - Colon Maternal Grandmother   ? Healthy Son   ? Heart murmur Son   ? ?Past Surgical History:  ?Procedure Laterality Date  ? CESAREAN SECTION    ? ?Social History  ? ?Social History Narrative  ? Not on file  ? ? ?There is no immunization history on file for this patient.  ? ?Objective: ?Vital Signs: BP 115/81 (BP Location: Right Arm, Patient Position: Sitting, Cuff Size: Normal)   Pulse 89   Ht 5\' 7"  (1.702 m)   Wt 227 lb (103 kg)   BMI 35.55 kg/m?   ? ?Physical Exam ?Vitals and nursing note reviewed.  ?Constitutional:   ?   Appearance: She is well-developed.  ?HENT:  ?   Head: Normocephalic and atraumatic.  ?Eyes:  ?   Conjunctiva/sclera: Conjunctivae normal.  ?Cardiovascular:  ?   Rate and Rhythm: Normal rate and regular rhythm.  ?   Heart sounds: Normal heart sounds.  ?Pulmonary:  ?   Effort: Pulmonary effort is normal.  ?   Breath sounds: Normal breath sounds.  ?Abdominal:  ?   General: Bowel sounds are normal.  ?   Palpations: Abdomen is soft.  ?Musculoskeletal:  ?   Cervical back: Normal range of motion.  ?Lymphadenopathy:  ?   Cervical: No cervical adenopathy.  ?Skin: ?   General: Skin is warm and dry.  ?   Capillary Refill: Capillary refill takes less than 2 seconds.  ?Neurological:  ?   Mental Status: She is alert and oriented to person, place, and time.  ?Psychiatric:     ?   Behavior: Behavior normal.  ?  ? ?Musculoskeletal Exam: C-spine thoracic and lumbar spine were in good range of motion.  She had mild tenderness in the midthoracic region.  There was no SI joint tenderness.  Shoulder joints, elbow joints, wrist joints with good range of  motion with no synovitis.  She had bilateral trapezius tenderness.  She had hypermobility in her MCPs, PIP and DIP joints.  Hip joints were in good range of motion without discomfort.  She had tenderness over bilateral trochanteric region.  She had good range of motion of bilateral knee joints without any warmth swelling or effusion.  She has some hypermobility in her ankle joints.  Bilateral pes cavus was noted. ? ?CDAI Exam: ?CDAI Score: -- ?Patient Global: --; Provider Global: -- ?Swollen: --; Tender: -- ?Joint Exam 01/29/2022  ? ?No joint exam has been documented for this visit  ? ?There is currently no information documented on the homunculus. Go to the Rheumatology activity and complete the homunculus joint exam. ? ?  Investigation: ?No additional findings. ? ?Imaging: ?No results found. ? ?Recent Labs: ?No results found for: WBC, HGB, PLT, NA, K, CL, CO2, GLUCOSE, BUN, CREATININE, BILITOT, ALKPHOS, AST, ALT, PROT, ALBUMIN, CALCIUM, GFRAA, QFTBGOLD, QFTBGOLDPLUS ? ?Speciality Comments: No specialty comments available. ? ?Procedures:  ?No procedures performed ?Allergies: Penicillins  ? ?Assessment / Plan:     ?Visit Diagnoses: Polyarthralgia-patient complains of pain and discomfort in multiple joints for over 10 years.  She states the pain gradually got worse over the last 8 years.  She has discomfort in her entire spine.  She was evaluated by neurologist in the past and was told that she had degenerative disc disease.  She also has discomfort in her shoulders, wrist joints, hands, ankles and feet.  She feels a popping sensation in her joints.  She states that she has flares when she has pain all over and notices some puffiness in her joints. ? ?Bilateral hand pain -she complains of pain and intermittent swelling in her hands.  Patient states she had work-up for lupus which was negative.  I will obtain following labs today.  Plan: Rheumatoid factor, Cyclic citrul peptide antibody, IgG, Sedimentation rate ? ?Pain  in both feet-she complains of popping sensation in her ankles and discomfort in her feet.  She is some hypermobility in her ankles.  She has bilateral pes cavus.  Proper fitting shoes with arch support we

## 2022-01-29 ENCOUNTER — Encounter: Payer: Self-pay | Admitting: Rheumatology

## 2022-01-29 ENCOUNTER — Ambulatory Visit (INDEPENDENT_AMBULATORY_CARE_PROVIDER_SITE_OTHER): Payer: 59 | Admitting: Rheumatology

## 2022-01-29 VITALS — BP 115/81 | HR 89 | Ht 67.0 in | Wt 227.0 lb

## 2022-01-29 DIAGNOSIS — Q796 Ehlers-Danlos syndrome, unspecified: Secondary | ICD-10-CM

## 2022-01-29 DIAGNOSIS — M79641 Pain in right hand: Secondary | ICD-10-CM

## 2022-01-29 DIAGNOSIS — M791 Myalgia, unspecified site: Secondary | ICD-10-CM

## 2022-01-29 DIAGNOSIS — M2550 Pain in unspecified joint: Secondary | ICD-10-CM

## 2022-01-29 DIAGNOSIS — M79672 Pain in left foot: Secondary | ICD-10-CM

## 2022-01-29 DIAGNOSIS — F5101 Primary insomnia: Secondary | ICD-10-CM

## 2022-01-29 DIAGNOSIS — M79671 Pain in right foot: Secondary | ICD-10-CM

## 2022-01-29 DIAGNOSIS — M249 Joint derangement, unspecified: Secondary | ICD-10-CM

## 2022-01-29 DIAGNOSIS — M797 Fibromyalgia: Secondary | ICD-10-CM

## 2022-01-29 DIAGNOSIS — M546 Pain in thoracic spine: Secondary | ICD-10-CM

## 2022-01-29 DIAGNOSIS — R5383 Other fatigue: Secondary | ICD-10-CM

## 2022-01-29 DIAGNOSIS — M25521 Pain in right elbow: Secondary | ICD-10-CM

## 2022-01-29 DIAGNOSIS — R002 Palpitations: Secondary | ICD-10-CM

## 2022-01-29 DIAGNOSIS — M62838 Other muscle spasm: Secondary | ICD-10-CM

## 2022-01-29 DIAGNOSIS — M79642 Pain in left hand: Secondary | ICD-10-CM

## 2022-01-30 ENCOUNTER — Other Ambulatory Visit: Payer: Self-pay | Admitting: *Deleted

## 2022-01-30 ENCOUNTER — Other Ambulatory Visit: Payer: Self-pay | Admitting: Family

## 2022-01-30 DIAGNOSIS — E559 Vitamin D deficiency, unspecified: Secondary | ICD-10-CM

## 2022-01-30 MED ORDER — VITAMIN D (ERGOCALCIFEROL) 1.25 MG (50000 UNIT) PO CAPS: 50000.0000 [IU] | ORAL_CAPSULE | ORAL | 0 refills | Status: AC

## 2022-01-30 NOTE — Telephone Encounter (Signed)
-----   Message from Pollyann Savoy, MD sent at 01/30/2022 12:15 PM EDT ----- ?Vitamin D is low at 19.  Please call in vitamin D 50,000 units once a week for 3 months.  She should stay on vitamin D 2000 units after she finishes the course of prescribed vitamin D.  She should have repeat vitamin D level in 3 months.  LFTs are el ?evated.  She should avoid all NSAIDs and Tylenol.  She should avoid alcohol intake. ?

## 2022-01-30 NOTE — Progress Notes (Signed)
Vitamin D is low at 19.  Please call in vitamin D 50,000 units once a week for 3 months.  She should stay on vitamin D 2000 units after she finishes the course of prescribed vitamin D.  She should have repeat vitamin D level in 3 months.  LFTs are elevated.  She should avoid all NSAIDs and Tylenol.  She should avoid alcohol intake.

## 2022-01-30 NOTE — Progress Notes (Signed)
Vitamin D prescribed on 01/30/2022 by Sherron Ales, PA.

## 2022-02-03 LAB — PROTEIN ELECTROPHORESIS, SERUM, WITH REFLEX
Albumin ELP: 4.1 g/dL (ref 3.8–4.8)
Alpha 1: 0.2 g/dL (ref 0.2–0.3)
Alpha 2: 0.6 g/dL (ref 0.5–0.9)
Beta 2: 0.3 g/dL (ref 0.2–0.5)
Beta Globulin: 0.4 g/dL (ref 0.4–0.6)
Gamma Globulin: 0.9 g/dL (ref 0.8–1.7)
Total Protein: 6.6 g/dL (ref 6.1–8.1)

## 2022-02-03 LAB — CBC WITH DIFFERENTIAL/PLATELET
Absolute Monocytes: 509 cells/uL (ref 200–950)
Basophils Absolute: 47 cells/uL (ref 0–200)
Basophils Relative: 0.7 %
Eosinophils Absolute: 147 cells/uL (ref 15–500)
Eosinophils Relative: 2.2 %
HCT: 42.3 % (ref 35.0–45.0)
Hemoglobin: 14 g/dL (ref 11.7–15.5)
Lymphs Abs: 2204 cells/uL (ref 850–3900)
MCH: 29.4 pg (ref 27.0–33.0)
MCHC: 33.1 g/dL (ref 32.0–36.0)
MCV: 88.9 fL (ref 80.0–100.0)
MPV: 9.7 fL (ref 7.5–12.5)
Monocytes Relative: 7.6 %
Neutro Abs: 3792 cells/uL (ref 1500–7800)
Neutrophils Relative %: 56.6 %
Platelets: 380 10*3/uL (ref 140–400)
RBC: 4.76 10*6/uL (ref 3.80–5.10)
RDW: 12.5 % (ref 11.0–15.0)
Total Lymphocyte: 32.9 %
WBC: 6.7 10*3/uL (ref 3.8–10.8)

## 2022-02-03 LAB — COMPLETE METABOLIC PANEL WITH GFR
AG Ratio: 1.6 (calc) (ref 1.0–2.5)
ALT: 60 U/L — ABNORMAL HIGH (ref 6–29)
AST: 38 U/L — ABNORMAL HIGH (ref 10–30)
Albumin: 4.1 g/dL (ref 3.6–5.1)
Alkaline phosphatase (APISO): 68 U/L (ref 31–125)
BUN: 11 mg/dL (ref 7–25)
CO2: 26 mmol/L (ref 20–32)
Calcium: 9.7 mg/dL (ref 8.6–10.2)
Chloride: 104 mmol/L (ref 98–110)
Creat: 0.81 mg/dL (ref 0.50–0.97)
Globulin: 2.6 g/dL (calc) (ref 1.9–3.7)
Glucose, Bld: 90 mg/dL (ref 65–99)
Potassium: 4.5 mmol/L (ref 3.5–5.3)
Sodium: 139 mmol/L (ref 135–146)
Total Bilirubin: 0.4 mg/dL (ref 0.2–1.2)
Total Protein: 6.7 g/dL (ref 6.1–8.1)
eGFR: 99 mL/min/{1.73_m2} (ref >=60)

## 2022-02-03 LAB — CK: Total CK: 93 U/L (ref 29–143)

## 2022-02-03 LAB — TSH: TSH: 1.32 mIU/L

## 2022-02-03 LAB — CYCLIC CITRUL PEPTIDE ANTIBODY, IGG: Cyclic Citrullin Peptide Ab: <16 UNITS

## 2022-02-03 LAB — VITAMIN D 25 HYDROXY (VIT D DEFICIENCY, FRACTURES): Vit D, 25-Hydroxy: 19 ng/mL — ABNORMAL LOW (ref 30–100)

## 2022-02-03 LAB — VITAMIN B12: Vitamin B-12: 484 pg/mL (ref 200–1100)

## 2022-02-03 LAB — RHEUMATOID FACTOR: Rheumatoid fact SerPl-aCnc: <14 IU/mL (ref <14)

## 2022-02-03 LAB — SEDIMENTATION RATE: Sed Rate: 2 mm/h (ref 0–20)

## 2022-02-03 NOTE — Progress Notes (Signed)
I will discuss results at the follow-up visit.

## 2022-02-05 NOTE — Progress Notes (Signed)
Office Visit Note  Patient: Faith Lawson             Date of Birth: 12/06/1990           MRN: [id number]             PCP: Camillia Herter, NP Referring: Camillia Herter, NP Visit Date: 02/19/2022 Occupation: @GUAROCC @  Subjective:  Pain in multiple joints and muscles  History of Present Illness: Faith Lawson is a 31 y.o. female returns today for the follow-up visit.  She has history of pain in multiple joints, fatigue, and myalgias.  She states she continues to have pain in thoracic spine and most of her muscles.  She still has discomfort in her shoulders, elbows, wrists, hands, knees and her ankles.  She has not noticed any joint swelling.  She was diagnosed with vitamin D deficiency and she has been taking vitamin D for the last month.  She was notified about the elevated LFTs.  She states she is not taking NSAIDs anymore.  She drinks alcohol only occasionally.  Activities of Daily Living:  Patient reports morning stiffness for all day. Patient Reports nocturnal pain.  Difficulty dressing/grooming: Denies Difficulty climbing stairs: Reports Difficulty getting out of chair: Denies Difficulty using hands for taps, buttons, cutlery, and/or writing: Reports  Review of Systems  Constitutional:  Positive for fatigue.  HENT:  Positive for nose dryness. Negative for mouth sores and mouth dryness.   Eyes:  Negative for pain, itching and dryness.  Respiratory:  Negative for shortness of breath and difficulty breathing.   Cardiovascular:  Positive for palpitations. Negative for chest pain.  Gastrointestinal:  Positive for constipation and diarrhea. Negative for blood in stool.  Endocrine: Negative for increased urination.  Genitourinary:  Negative for difficulty urinating.  Musculoskeletal:  Positive for joint pain, joint pain, myalgias, morning stiffness, muscle tenderness and myalgias. Negative for joint swelling.  Skin:  Negative for color change, rash, redness and sensitivity to  sunlight.  Allergic/Immunologic: Negative for susceptible to infections.  Neurological:  Positive for dizziness, numbness and weakness.  Hematological:  Positive for bruising/bleeding tendency.  Psychiatric/Behavioral:  Negative for depressed mood. The patient is not nervous/anxious.    PMFS History:  Patient Active Problem List   Diagnosis Date Noted   Vitamin D deficiency 02/19/2022   Bilateral hand pain 10/29/2021   Bilateral elbow joint pain 10/29/2021    Past Medical History:  Diagnosis Date   DDD (degenerative disc disease), lumbar    DDD (degenerative disc disease), thoracic    Fibromyalgia     Family History  Problem Relation Age of Onset   Fibromyalgia Mother    Osteoarthritis Mother    Hypertension Father    Healthy Brother    Cancer - Colon Maternal Grandmother    Healthy Son    Heart murmur Son    Past Surgical History:  Procedure Laterality Date   CESAREAN SECTION     Social History   Social History Narrative   Not on file    There is no immunization history on file for this patient.   Objective: Vital Signs: BP 115/82 (BP Location: Left Arm, Patient Position: Sitting, Cuff Size: Normal)   Pulse (!) 106   Ht 5' 6"  (1.676 m)   Wt 228 lb 6.4 oz (103.6 kg)   BMI 36.86 kg/m    Physical Exam Vitals and nursing note reviewed.  Constitutional:      Appearance: She is well-developed.  HENT:  Head: Normocephalic and atraumatic.  Eyes:     Conjunctiva/sclera: Conjunctivae normal.  Cardiovascular:     Rate and Rhythm: Normal rate and regular rhythm.     Heart sounds: Normal heart sounds.  Pulmonary:     Effort: Pulmonary effort is normal.     Breath sounds: Normal breath sounds.  Abdominal:     General: Bowel sounds are normal.     Palpations: Abdomen is soft.  Musculoskeletal:     Cervical back: Normal range of motion.  Lymphadenopathy:     Cervical: No cervical adenopathy.  Skin:    General: Skin is warm and dry.     Capillary Refill:  Capillary refill takes less than 2 seconds.  Neurological:     Mental Status: She is alert and oriented to person, place, and time.  Psychiatric:        Behavior: Behavior normal.     Musculoskeletal Exam: C-spine thoracic and lumbar spine were in good range of motion.  She had discomfort in the thoracic region.  Shoulder joints, elbow joints, wrist joints, MCPs PIPs and DIPs with good range of motion with no synovitis.  Hip joints, knee joints, ankles, MTPs and PIPs with good range of motion with no synovitis.  She had bilateral pes cavus.  CDAI Exam: CDAI Score: -- Patient Global: --; Provider Global: -- Swollen: --; Tender: -- Joint Exam 02/19/2022   No joint exam has been documented for this visit   There is currently no information documented on the homunculus. Go to the Rheumatology activity and complete the homunculus joint exam.  Investigation: No additional findings.  Imaging: No results found.  Recent Labs: Lab Results  Component Value Date   WBC 6.7 01/29/2022   HGB 14.0 01/29/2022   PLT 380 01/29/2022   NA 139 01/29/2022   K 4.5 01/29/2022   CL 104 01/29/2022   CO2 26 01/29/2022   GLUCOSE 90 01/29/2022   BUN 11 01/29/2022   CREATININE 0.81 01/29/2022   BILITOT 0.4 01/29/2022   AST 38 (H) 01/29/2022   ALT 60 (H) 01/29/2022   PROT 6.7 01/29/2022   PROT 6.6 01/29/2022   CALCIUM 9.7 01/29/2022   Jan 29, 2022 SPEP normal, ESR 2, CK 93, TSH normal, B12 normal, vitamin D 19, RF negative, anti-CCP negative  Speciality Comments: No specialty comments available.  Procedures:  No procedures performed Allergies: Penicillins   Assessment / Plan:     Visit Diagnoses: Polyarthralgia - History of pain in multiple joints over the last 10 years.  She c/o pain in her shoulders, wrists, hands, ankles and her feet.  H/o pain in the entire spine.  She had no point tenderness.  No synovitis was noted.  I believe most of the discomfort is coming from  fibromyalgia.  Bilateral hand pain - History of intermittent swelling in the hands.  No synovitis was noted.  All autoimmune work-up negative.  Lab findings were reviewed with the patient.  Pain in both feet - History of popping sensation in the ankles and discomfort in the feet.  Hypermobility in the ankle joints and bilateral pes cavus was noted.  Use of arch support was discussed.  Vitamin D deficiency - Vitamin D 19.  Patient was placed on vitamin D 50,000 units once a week on Jan 30, 2022.  We will recheck her vitamin D after 3 months of dosing.  Advised her to stay on vitamin D 2000 units daily after she finishes the course.  Elevated LFTs - Avoidance  of NSAIDs and alcohol intake was advised. -Patient states she occasionally takes NSAIDs and occasionally drinks alcohol.  She is completely stopped now.  Will recheck labs in couple of months with her vitamin D labs.  Plan: Hepatic function panel, Hepatitis B core antibody, IgM, Hepatitis B surface antigen, Hepatitis C antibody  Fibromyalgia - She was diagnosed with fibromyalgia in [place].  She has been on Cymbalta and Flexeril for 2 years which is helpful.  She has been going to integrative therapies for the last 2 months.  She has noticed improvement in her overall symptoms since she has been going to integrative therapies.  Need for regular exercise including water aerobics and swimming was emphasized.  Primary insomnia - History of chronic insomnia for many years.  She gets some relief from Flexeril.  Good sleep hygiene was discussed.  Other fatigue - Most likely related to fatigue and fibromyalgia.  Vitamin D deficiency could be contributing as well.  Trapezius muscle spasm - Physical therapy has been helpful.  Hypermobility of joint - She was referred to sports medicine.  Patient would like to wait before she will go to sports medicine at this point.  Pain in thoracic spine - She was diagnosed with degenerative disc disease in [place].  A handout on back exercises was given.  Palpitations - Cardiology work-up was negative per patient.  Orders: Orders Placed This Encounter  Procedures   Hepatic function panel   Hepatitis B core antibody, IgM   Hepatitis B surface antigen   Hepatitis C antibody   No orders of the defined types were placed in this encounter.    Follow-Up Instructions: Return in about 3 months (around 05/22/2022) for polyarthralgia.   Bo Merino, MD  Note - This record has been created using Editor, commissioning.  Chart creation errors have been sought, but may not always  have been located. Such creation errors do not reflect on  the standard of medical care.

## 2022-02-19 ENCOUNTER — Ambulatory Visit (INDEPENDENT_AMBULATORY_CARE_PROVIDER_SITE_OTHER): Payer: Self-pay | Admitting: Rheumatology

## 2022-02-19 ENCOUNTER — Encounter: Payer: Self-pay | Admitting: Rheumatology

## 2022-02-19 VITALS — BP 115/82 | HR 106 | Ht 66.0 in | Wt 228.4 lb

## 2022-02-19 DIAGNOSIS — M2550 Pain in unspecified joint: Secondary | ICD-10-CM

## 2022-02-19 DIAGNOSIS — R7989 Other specified abnormal findings of blood chemistry: Secondary | ICD-10-CM

## 2022-02-19 DIAGNOSIS — M249 Joint derangement, unspecified: Secondary | ICD-10-CM

## 2022-02-19 DIAGNOSIS — M79642 Pain in left hand: Secondary | ICD-10-CM

## 2022-02-19 DIAGNOSIS — M797 Fibromyalgia: Secondary | ICD-10-CM

## 2022-02-19 DIAGNOSIS — M62838 Other muscle spasm: Secondary | ICD-10-CM

## 2022-02-19 DIAGNOSIS — M546 Pain in thoracic spine: Secondary | ICD-10-CM

## 2022-02-19 DIAGNOSIS — M79672 Pain in left foot: Secondary | ICD-10-CM

## 2022-02-19 DIAGNOSIS — E559 Vitamin D deficiency, unspecified: Secondary | ICD-10-CM

## 2022-02-19 DIAGNOSIS — F5101 Primary insomnia: Secondary | ICD-10-CM

## 2022-02-19 DIAGNOSIS — R002 Palpitations: Secondary | ICD-10-CM

## 2022-02-19 DIAGNOSIS — R5383 Other fatigue: Secondary | ICD-10-CM

## 2022-02-19 DIAGNOSIS — M79671 Pain in right foot: Secondary | ICD-10-CM

## 2022-02-19 DIAGNOSIS — M79641 Pain in right hand: Secondary | ICD-10-CM

## 2022-02-19 NOTE — Patient Instructions (Signed)

## 2022-02-24 ENCOUNTER — Ambulatory Visit: Payer: Self-pay | Admitting: *Deleted

## 2022-02-24 ENCOUNTER — Encounter (HOSPITAL_BASED_OUTPATIENT_CLINIC_OR_DEPARTMENT_OTHER): Payer: Self-pay | Admitting: Pediatrics

## 2022-02-24 ENCOUNTER — Emergency Department (HOSPITAL_BASED_OUTPATIENT_CLINIC_OR_DEPARTMENT_OTHER)
Admission: EM | Admit: 2022-02-24 | Discharge: 2022-02-24 | Disposition: A | Discharge status: Home / Self Care | Payer: BLUE CROSS/BLUE SHIELD | Attending: Emergency Medicine | Admitting: Emergency Medicine

## 2022-02-24 ENCOUNTER — Other Ambulatory Visit: Payer: Self-pay

## 2022-02-24 ENCOUNTER — Emergency Department (HOSPITAL_BASED_OUTPATIENT_CLINIC_OR_DEPARTMENT_OTHER): Payer: BLUE CROSS/BLUE SHIELD

## 2022-02-24 DIAGNOSIS — X509XXA Other and unspecified overexertion or strenuous movements or postures, initial encounter: Secondary | ICD-10-CM | POA: Insufficient documentation

## 2022-02-24 DIAGNOSIS — M546 Pain in thoracic spine: Secondary | ICD-10-CM | POA: Diagnosis present

## 2022-02-24 MED ORDER — KETOROLAC TROMETHAMINE 15 MG/ML IJ SOLN
15.0000 mg | Freq: Once | INTRAMUSCULAR | Status: AC
  Administered 2022-02-24: 15 mg via INTRAMUSCULAR
  Filled 2022-02-24: qty 1

## 2022-02-24 MED ORDER — CYCLOBENZAPRINE HCL 5 MG PO TABS
5.0000 mg | ORAL_TABLET | Freq: Once | ORAL | Status: AC
  Administered 2022-02-24: 5 mg via ORAL
  Filled 2022-02-24: qty 1

## 2022-02-24 MED ORDER — NAPROXEN 375 MG PO TABS: 375.0000 mg | ORAL_TABLET | Freq: Two times a day (BID) | ORAL | 0 refills | Status: AC

## 2022-02-24 NOTE — ED Triage Notes (Signed)
C/O back pain started last night after some hypermobility stretches;

## 2022-02-24 NOTE — ED Provider Notes (Signed)
[facility] EMERGENCY DEPARTMENT Provider Note   CSN: [id number] Arrival date & time: 02/24/22  1209     History Chief Complaint  Patient presents with   Back Pain    Faith Lawson is a 31 y.o. female who presents to the emergency department with upper back pain that started yesterday.  Patient has a history of hypermobility syndrome.  While she was stretching she felt a popping sensation in her spine and followed by immediate pain.  She had trouble using the left arm yesterday.  Her friend came over and massaged her back in "put her spine back in."  After that incident patient was able to move her left arm better but she has had pain since.   Back Pain     Home Medications Prior to Admission medications   Medication Sig Start Date End Date Taking? Authorizing Provider  naproxen (NAPROSYN) 375 MG tablet Take 1 tablet (375 mg total) by mouth 2 (two) times daily. 02/24/22  Yes Wanza Szumski M, PA-C  cyclobenzaprine (FLEXERIL) 10 MG tablet Take 10 mg by mouth daily.    [provider]  DULoxetine (CYMBALTA) 60 MG capsule Take 60 mg by mouth daily.    [provider]  Vitamin D, Ergocalciferol, (DRISDOL) 1.25 MG (50000 UNIT) CAPS capsule Take 1 capsule (50,000 Units total) by mouth every 7 (seven) days. 01/30/22   Gearldine Bienenstock, PA-C      Allergies    Penicillins    Review of Systems   Review of Systems  Musculoskeletal:  Positive for back pain.  All other systems reviewed and are negative.  Physical Exam Updated Vital Signs BP 107/83   Pulse 87   Temp 98.8 F (37.1 C) (Oral)   Resp 18   Ht 5\' 6"  (1.676 m)   Wt (!) 225 kg   LMP 02/08/2022 (Approximate)   SpO2 100%   BMI 80.06 kg/m  Physical Exam Vitals and nursing note reviewed.  Constitutional:      Appearance: Normal appearance.  HENT:     Head: Normocephalic and atraumatic.  Eyes:     General:        Right eye: No discharge.        Left eye: No discharge.     Conjunctiva/sclera:  Conjunctivae normal.  Pulmonary:     Effort: Pulmonary effort is normal.  Musculoskeletal:     Comments: There is mild tenderness to the thoracic spine.  Patient has 5/5 grip strength and full range of motion in the arms.  Normal sensation in the upper extremities.  Skin:    General: Skin is warm and dry.     Findings: No rash.  Neurological:     General: No focal deficit present.     Mental Status: She is alert.  Psychiatric:        Mood and Affect: Mood normal.        Behavior: Behavior normal.    ED Results / Procedures / Treatments   Labs (all labs ordered are listed, but only abnormal results are displayed) Labs Reviewed - No data to display  EKG None  Radiology DG Thoracic Spine 2 View  Result Date: 02/24/2022 CLINICAL DATA:  upper back pain. hx of hypermobility syndrome EXAM: THORACIC SPINE 2 VIEWS COMPARISON:  None Available. FINDINGS: There is no evidence of thoracic spine fracture. Alignment is normal. No other significant bone abnormalities are identified. IMPRESSION: Negative. Electronically Signed   By: 04/26/2022 M.D.   On: 02/24/2022 16:30  Procedures Procedures    Medications Ordered in ED Medications  cyclobenzaprine (FLEXERIL) tablet 5 mg (has no administration in time range)  ketorolac (TORADOL) 15 MG/ML injection 15 mg (15 mg Intramuscular Given 02/24/22 1556)    ED Course/ Medical Decision Making/ A&P                           Medical Decision Making Faith Lawson is a 31 y.o. female patient who presents to the emerged department with upper back pain after doing some stretching.  I have a low suspicion for acute spinal pathology at this time.  Given that the patient does have hypermobility syndrome we will get an x-ray to further assess.  I personally reviewed the thoracic spine x-ray which did not show any fracture dislocations.  I do agree with the radiologist interpretation.  This is likely a muscular strain.  We will give her naproxen to go  home with.  I will have her follow-up with your primary care provider for further evaluation.  Strict return precautions were discussed with the patient at the bedside.  She is safe for discharge.   Amount and/or Complexity of Data Reviewed Radiology: ordered.  Risk Prescription drug management.   Final Clinical Impression(s) / ED Diagnoses Final diagnoses:  Acute midline thoracic back pain    Rx / DC Orders ED Discharge Orders          Ordered    naproxen (NAPROSYN) 375 MG tablet  2 times daily        02/24/22 1642              Honor Loh [place], [place] 02/24/22 1645    Long, Arlyss Repress, MD 02/25/22 1323

## 2022-02-24 NOTE — Telephone Encounter (Signed)
  Chief Complaint: severe back pain Symptoms: upper back between shoulders painful and felt pop last night. Weakness picking up heavier objects Frequency: last night  Pertinent Negatives: Patient denies numbness in bilateral hands  Disposition: [x] ED /[] Urgent Care (no appt availability in office) / [] Appointment(In office/virtual)/ []  [facility]/ [] Home Care/ [] Refused Recommended Disposition /[] [facility]/ []  Follow-up with PCP Additional Notes:  Recommended  ED due to severe pain    Reason for Disposition  [1] SEVERE back pain (e.g., excruciating, unable to do any normal activities) AND [2] not improved 2 hours after pain medicine  Answer Assessment - Initial Assessment Questions 1. ONSET: "When did the pain begin?"      Yesterday  2. LOCATION: "Where does it hurt?" (upper, mid or lower back)     Upper back  3. SEVERITY: "How bad is the pain?"  (e.g., Scale 1-10; mild, moderate, or severe)   - MILD (1-3): doesn't interfere with normal activities    - MODERATE (4-7): interferes with normal activities or awakens from sleep    - SEVERE (8-10): excruciating pain, unable to do any normal activities      severe 4. PATTERN: "Is the pain constant?" (e.g., yes, no; constant, intermittent)     Constant  5. RADIATION: "Does the pain shoot into your legs or elsewhere?"     arms 6. CAUSE:  "What do you think is causing the back pain?"      Not sure felt back pop last night  7. BACK OVERUSE:  "Any recent lifting of heavy objects, strenuous work or exercise?"     na 8. MEDICATIONS: "What have you taken so far for the pain?" (e.g., nothing, acetaminophen, NSAIDS)     na 9. NEUROLOGIC SYMPTOMS: "Do you have any weakness, numbness, or problems with bowel/bladder control?"     Upper extremities with difficulty picking up heavier objects  10. OTHER SYMPTOMS: "Do you have any other symptoms?" (e.g., fever, abdominal pain, burning with urination, blood in urine)       Pain in  upper back  11. PREGNANCY: "Is there any chance you are pregnant?" (e.g., yes, no; LMP)       na  Protocols used: Back Pain-A-AH

## 2022-02-24 NOTE — Discharge Instructions (Addendum)
X-ray was negative which is great news.  This is likely a muscular or spinal strain.  I given you some anti-inflammatories to take twice per day.  I would like you to follow-up with your primary care doctor for further evaluation.  Please return to the emergency department for any worsening symptoms you might have.

## 2022-04-09 ENCOUNTER — Other Ambulatory Visit: Payer: Self-pay | Admitting: *Deleted

## 2022-04-09 DIAGNOSIS — R7989 Other specified abnormal findings of blood chemistry: Secondary | ICD-10-CM

## 2022-04-10 LAB — HEPATIC FUNCTION PANEL
AG Ratio: 1.8 (calc) (ref 1.0–2.5)
ALT: 26 U/L (ref 6–29)
AST: 23 U/L (ref 10–30)
Albumin: 4.2 g/dL (ref 3.6–5.1)
Alkaline phosphatase (APISO): 59 U/L (ref 31–125)
Bilirubin, Direct: 0.1 mg/dL (ref 0.0–0.2)
Globulin: 2.4 g/dL (calc) (ref 1.9–3.7)
Indirect Bilirubin: 0.4 mg/dL (calc) (ref 0.2–1.2)
Total Bilirubin: 0.5 mg/dL (ref 0.2–1.2)
Total Protein: 6.6 g/dL (ref 6.1–8.1)

## 2022-04-10 LAB — HEPATITIS C ANTIBODY: Hepatitis C Ab: NONREACTIVE

## 2022-04-10 LAB — HEPATITIS B CORE ANTIBODY, IGM: Hep B C IgM: NONREACTIVE

## 2022-04-10 LAB — HEPATITIS B SURFACE ANTIGEN: Hepatitis B Surface Ag: NONREACTIVE

## 2022-04-10 NOTE — Progress Notes (Signed)
Repeat liver functions are normal.  Labs are negative for hepatitis B and hepatitis C.

## 2022-05-19 NOTE — Progress Notes (Deleted)
   Office Visit Note  Patient: Faith Lawson             Date of Birth: June 01, 1991           MRN: [id number]             PCP: Rema Fendt, NP Referring: Rema Fendt, NP Visit Date: 06/02/2022 Occupation: @GUAROCC @  Subjective:  No chief complaint on file.   History of Present Illness: Faith Lawson is a 31 y.o. female ***   Activities of Daily Living:  Patient reports morning stiffness for *** {minute/hour:19697}.   Patient {ACTIONS;DENIES/REPORTS:21021675::"Denies"} nocturnal pain.  Difficulty dressing/grooming: {ACTIONS;DENIES/REPORTS:21021675::"Denies"} Difficulty climbing stairs: {ACTIONS;DENIES/REPORTS:21021675::"Denies"} Difficulty getting out of chair: {ACTIONS;DENIES/REPORTS:21021675::"Denies"} Difficulty using hands for taps, buttons, cutlery, and/or writing: {ACTIONS;DENIES/REPORTS:21021675::"Denies"}  No Rheumatology ROS completed.   PMFS History:  Patient Active Problem List   Diagnosis Date Noted   Vitamin D deficiency 02/19/2022   Bilateral hand pain 10/29/2021   Bilateral elbow joint pain 10/29/2021    Past Medical History:  Diagnosis Date   DDD (degenerative disc disease), lumbar    DDD (degenerative disc disease), thoracic    Fibromyalgia     Family History  Problem Relation Age of Onset   Fibromyalgia Mother    Osteoarthritis Mother    Hypertension Father    Healthy Brother    Cancer - Colon Maternal Grandmother    Healthy Son    Heart murmur Son    Past Surgical History:  Procedure Laterality Date   CESAREAN SECTION     Social History   Social History Narrative   Not on file    There is no immunization history on file for this patient.   Objective: Vital Signs: There were no vitals taken for this visit.   Physical Exam   Musculoskeletal Exam: ***  CDAI Exam: CDAI Score: -- Patient Global: --; Provider Global: -- Swollen: --; Tender: -- Joint Exam 06/02/2022   No joint exam has been documented for this visit   There  is currently no information documented on the homunculus. Go to the Rheumatology activity and complete the homunculus joint exam.  Investigation: No additional findings.  Imaging: No results found.  Recent Labs: Lab Results  Component Value Date   WBC 6.7 01/29/2022   HGB 14.0 01/29/2022   PLT 380 01/29/2022   NA 139 01/29/2022   K 4.5 01/29/2022   CL 104 01/29/2022   CO2 26 01/29/2022   GLUCOSE 90 01/29/2022   BUN 11 01/29/2022   CREATININE 0.81 01/29/2022   BILITOT 0.5 04/09/2022   AST 23 04/09/2022   ALT 26 04/09/2022   PROT 6.6 04/09/2022   CALCIUM 9.7 01/29/2022    Speciality Comments: No specialty comments available.  Procedures:  No procedures performed Allergies: Penicillins   Assessment / Plan:     Visit Diagnoses: No diagnosis found.  Orders: No orders of the defined types were placed in this encounter.  No orders of the defined types were placed in this encounter.   Face-to-face time spent with patient was *** minutes. Greater than 50% of time was spent in counseling and coordination of care.  Follow-Up Instructions: No follow-ups on file.   03/31/2022, CMA  Note - This record has been created using Ellen Henri.  Chart creation errors have been sought, but may not always  have been located. Such creation errors do not reflect on  the standard of medical care.

## 2022-06-02 ENCOUNTER — Ambulatory Visit: Payer: Self-pay | Admitting: Physician Assistant

## 2022-06-02 DIAGNOSIS — M797 Fibromyalgia: Secondary | ICD-10-CM

## 2022-06-02 DIAGNOSIS — E559 Vitamin D deficiency, unspecified: Secondary | ICD-10-CM

## 2022-06-02 DIAGNOSIS — M79641 Pain in right hand: Secondary | ICD-10-CM

## 2022-06-02 DIAGNOSIS — R5383 Other fatigue: Secondary | ICD-10-CM

## 2022-06-02 DIAGNOSIS — M546 Pain in thoracic spine: Secondary | ICD-10-CM

## 2022-06-02 DIAGNOSIS — M79671 Pain in right foot: Secondary | ICD-10-CM

## 2022-06-02 DIAGNOSIS — F5101 Primary insomnia: Secondary | ICD-10-CM

## 2022-06-02 DIAGNOSIS — M2550 Pain in unspecified joint: Secondary | ICD-10-CM

## 2022-06-02 DIAGNOSIS — M62838 Other muscle spasm: Secondary | ICD-10-CM

## 2022-06-02 DIAGNOSIS — R002 Palpitations: Secondary | ICD-10-CM

## 2022-06-02 DIAGNOSIS — R7989 Other specified abnormal findings of blood chemistry: Secondary | ICD-10-CM

## 2022-06-02 DIAGNOSIS — M249 Joint derangement, unspecified: Secondary | ICD-10-CM

## 2023-02-19 IMAGING — DX DG THORACIC SPINE 2V
3 series · 3 of 3 positions shown · non-contrast
Comparison: None Available.

CLINICAL DATA: upper back pain. hx of hypermobility syndrome

EXAM:
THORACIC SPINE 2 VIEWS

[t-spine ap]
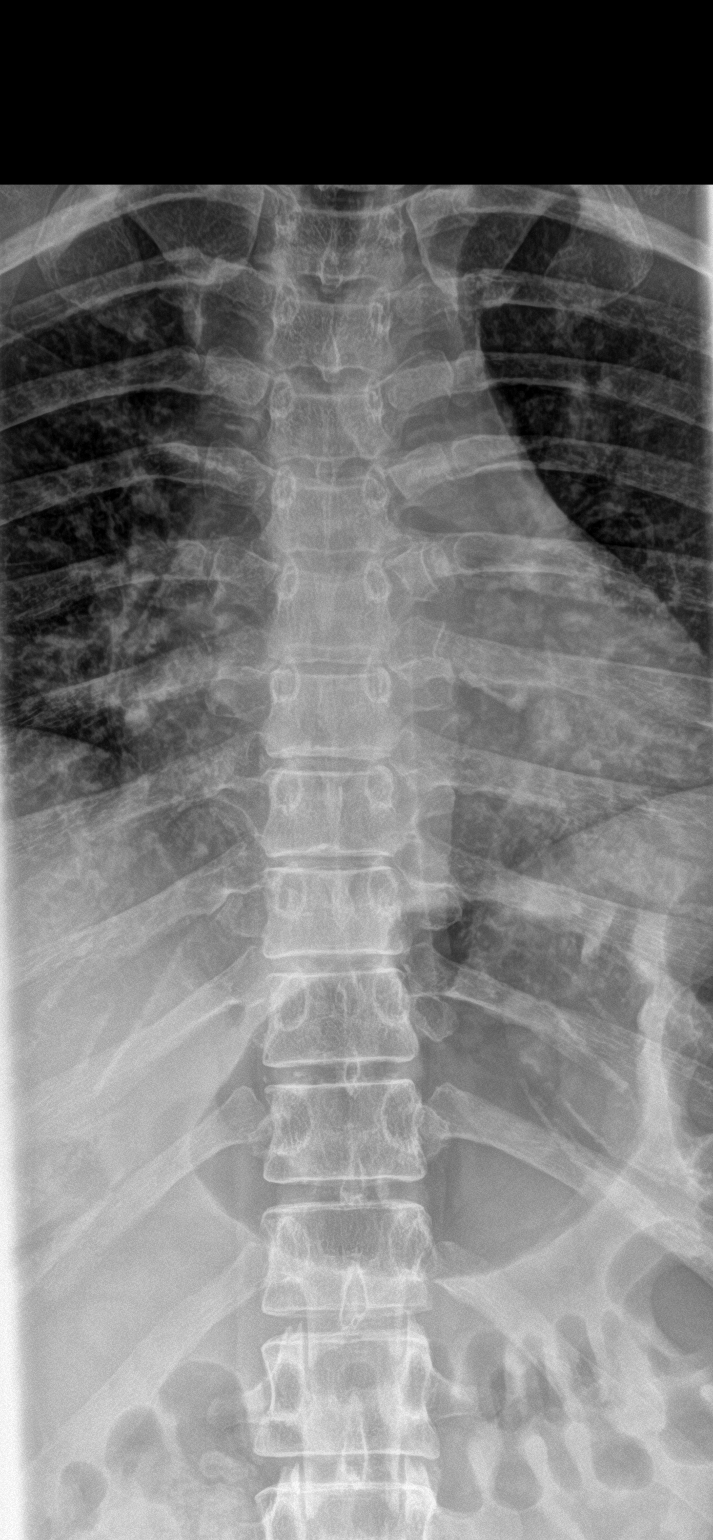

[t-spine lat]
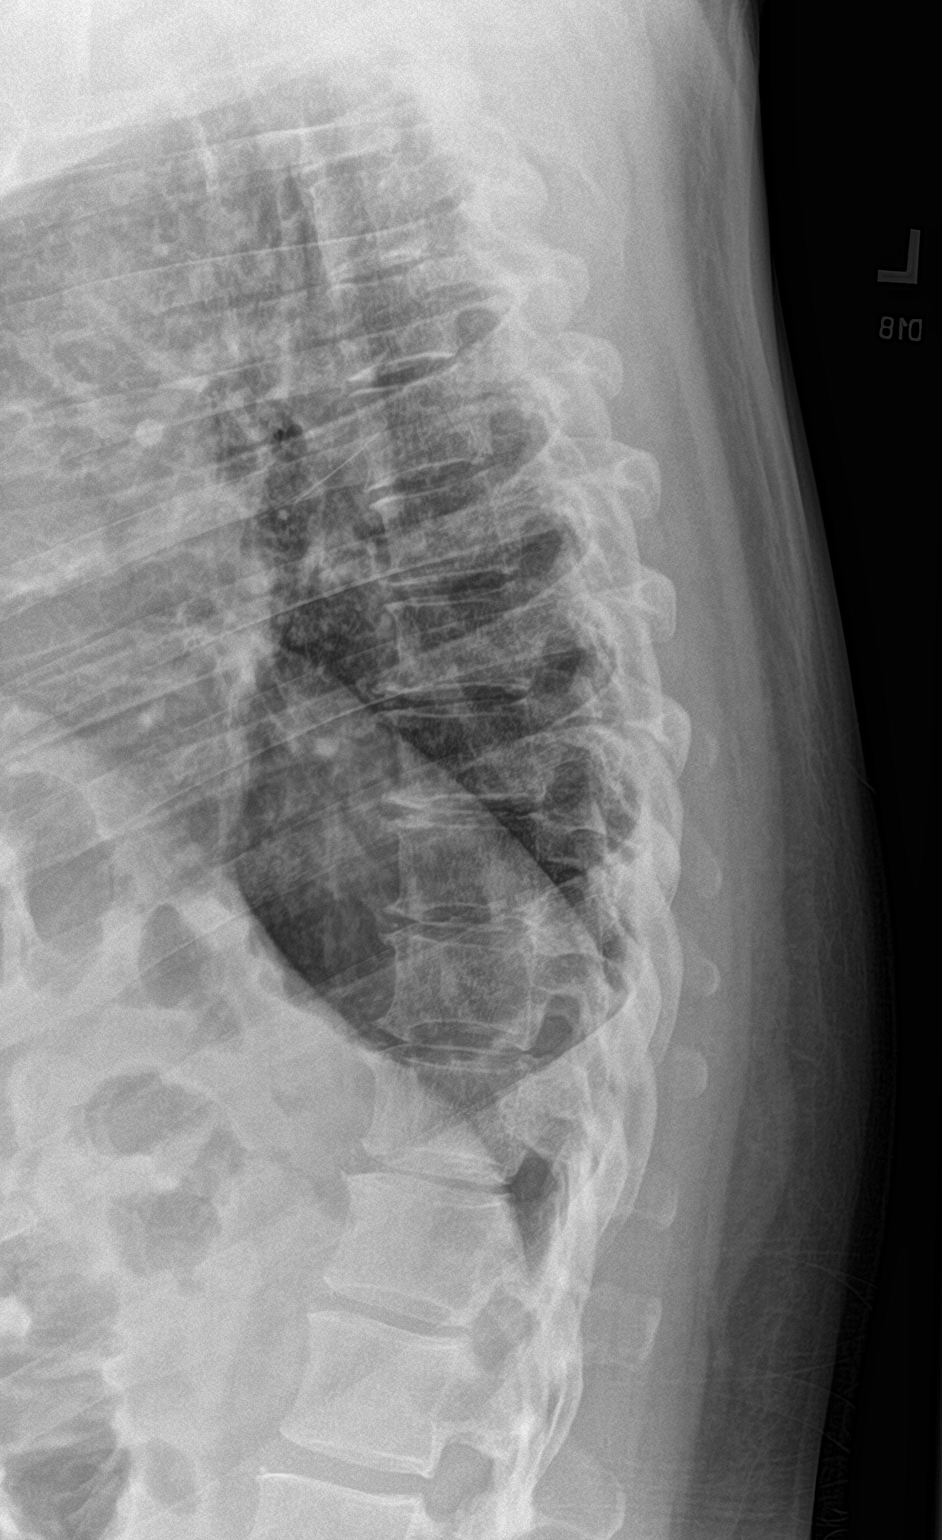

[t-spine swimmers]
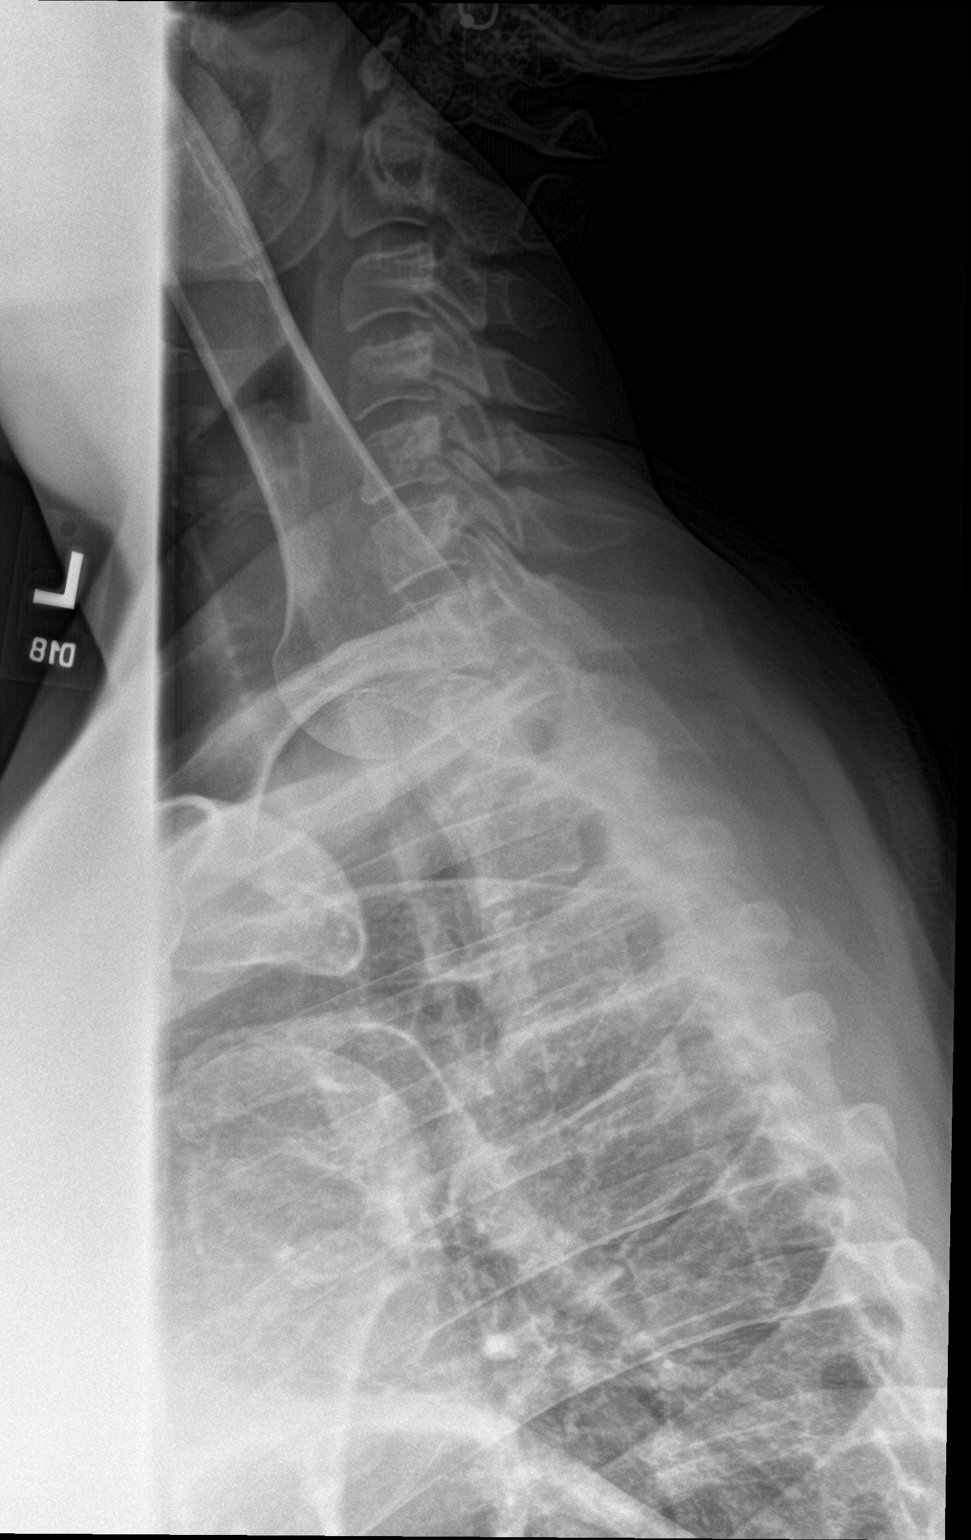

[3 of 3 positions shown; findings below may reference images not displayed]

FINDINGS: There is no evidence of thoracic spine fracture. Alignment is
normal. No other significant bone abnormalities are identified.
IMPRESSION: Negative.
